# Patient Record
Sex: Male | Born: 1995 | Race: Black or African American | Hispanic: No | Marital: Single | Smoking: Never smoker
Health system: Southern US, Community
[De-identification: ages and names within clinical notes are randomized; demographics above are authoritative.]

---
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
## 2015-07-17 ENCOUNTER — Emergency Department (HOSPITAL_COMMUNITY)
Admission: EM | Admit: 2015-07-17 | Discharge: 2015-07-17 | Disposition: A | Discharge status: Home / Self Care | Payer: Self-pay | Attending: Emergency Medicine | Admitting: Emergency Medicine

## 2015-07-17 ENCOUNTER — Emergency Department (HOSPITAL_COMMUNITY): Payer: Self-pay

## 2015-07-17 ENCOUNTER — Encounter (HOSPITAL_COMMUNITY): Payer: Self-pay | Admitting: Emergency Medicine

## 2015-07-17 DIAGNOSIS — Y939 Activity, unspecified: Secondary | ICD-10-CM | POA: Insufficient documentation

## 2015-07-17 DIAGNOSIS — S71131A Puncture wound without foreign body, right thigh, initial encounter: Secondary | ICD-10-CM | POA: Insufficient documentation

## 2015-07-17 DIAGNOSIS — Y999 Unspecified external cause status: Secondary | ICD-10-CM | POA: Insufficient documentation

## 2015-07-17 DIAGNOSIS — W3400XA Accidental discharge from unspecified firearms or gun, initial encounter: Secondary | ICD-10-CM | POA: Insufficient documentation

## 2015-07-17 DIAGNOSIS — R791 Abnormal coagulation profile: Secondary | ICD-10-CM | POA: Insufficient documentation

## 2015-07-17 DIAGNOSIS — Y929 Unspecified place or not applicable: Secondary | ICD-10-CM | POA: Insufficient documentation

## 2015-07-17 DIAGNOSIS — S41031A Puncture wound without foreign body of right shoulder, initial encounter: Secondary | ICD-10-CM | POA: Insufficient documentation

## 2015-07-17 DIAGNOSIS — S71132A Puncture wound without foreign body, left thigh, initial encounter: Secondary | ICD-10-CM | POA: Insufficient documentation

## 2015-07-17 LAB — TYPE AND SCREEN
ABO/RH(D): A POS
ANTIBODY SCREEN: NEGATIVE
UNIT DIVISION: 0
Unit division: 0

## 2015-07-17 LAB — COMPREHENSIVE METABOLIC PANEL
ALBUMIN: 4.4 g/dL (ref 3.5–5.0)
ALK PHOS: 63 U/L (ref 38–126)
ALT: 15 U/L — ABNORMAL LOW (ref 17–63)
ANION GAP: 11 (ref 5–15)
AST: 27 U/L (ref 15–41)
BILIRUBIN TOTAL: 1 mg/dL (ref 0.3–1.2)
BUN: 11 mg/dL (ref 6–20)
CALCIUM: 10 mg/dL (ref 8.9–10.3)
CO2: 22 mmol/L (ref 22–32)
Chloride: 105 mmol/L (ref 101–111)
Creatinine, Ser: 1.28 mg/dL — ABNORMAL HIGH (ref 0.61–1.24)
GFR calc Af Amer: >60 mL/min (ref >60)
GFR calc non Af Amer: >60 mL/min (ref >60)
GLUCOSE: 120 mg/dL — AB (ref 65–99)
Potassium: 3.3 mmol/L — ABNORMAL LOW (ref 3.5–5.1)
SODIUM: 138 mmol/L (ref 135–145)
TOTAL PROTEIN: 7.3 g/dL (ref 6.5–8.1)

## 2015-07-17 LAB — I-STAT CHEM 8, ED
BUN: 12 mg/dL (ref 6–20)
CALCIUM ION: 1.21 mmol/L (ref 1.12–1.23)
CHLORIDE: 105 mmol/L (ref 101–111)
Creatinine, Ser: 1.1 mg/dL (ref 0.61–1.24)
Glucose, Bld: 115 mg/dL — ABNORMAL HIGH (ref 65–99)
HEMATOCRIT: 49 % (ref 39.0–52.0)
Hemoglobin: 16.7 g/dL (ref 13.0–17.0)
POTASSIUM: 3.4 mmol/L — AB (ref 3.5–5.1)
SODIUM: 142 mmol/L (ref 135–145)
TCO2: 22 mmol/L (ref 0–100)

## 2015-07-17 LAB — CBC
HEMATOCRIT: 45.9 % (ref 39.0–52.0)
HEMOGLOBIN: 15.1 g/dL (ref 13.0–17.0)
MCH: 26.7 pg (ref 26.0–34.0)
MCHC: 32.9 g/dL (ref 30.0–36.0)
MCV: 81.2 fL (ref 78.0–100.0)
Platelets: 194 10*3/uL (ref 150–400)
RBC: 5.65 MIL/uL (ref 4.22–5.81)
RDW: 13.5 % (ref 11.5–15.5)
WBC: 5.7 10*3/uL (ref 4.0–10.5)

## 2015-07-17 LAB — PREPARE FRESH FROZEN PLASMA
UNIT DIVISION: 0
Unit division: 0

## 2015-07-17 LAB — ETHANOL

## 2015-07-17 LAB — I-STAT CG4 LACTIC ACID, ED: Lactic Acid, Venous: 4.87 mmol/L (ref 0.5–2.0)

## 2015-07-17 LAB — PROTIME-INR
INR: 1.16 (ref 0.00–1.49)
Prothrombin Time: 15 seconds (ref 11.6–15.2)

## 2015-07-17 MED ORDER — BACITRACIN ZINC 500 UNIT/GM EX OINT
TOPICAL_OINTMENT | Freq: Two times a day (BID) | CUTANEOUS | Status: DC
  Administered 2015-07-17: 15:00:00 via TOPICAL
  Filled 2015-07-17: qty 0.9

## 2015-07-17 MED ORDER — ONDANSETRON HCL 4 MG/2ML IJ SOLN
4.0000 mg | Freq: Once | INTRAMUSCULAR | Status: AC
  Administered 2015-07-17: 4 mg via INTRAVENOUS
  Filled 2015-07-17: qty 2

## 2015-07-17 MED ORDER — CEFAZOLIN IN D5W 1 GM/50ML IV SOLN
1.0000 g | Freq: Once | INTRAVENOUS | Status: AC
  Administered 2015-07-17: 1 g via INTRAVENOUS
  Filled 2015-07-17: qty 50

## 2015-07-17 MED ORDER — POTASSIUM CHLORIDE CRYS ER 20 MEQ PO TBCR
40.0000 meq | EXTENDED_RELEASE_TABLET | Freq: Once | ORAL | Status: AC
  Administered 2015-07-17: 40 meq via ORAL
  Filled 2015-07-17: qty 2

## 2015-07-17 MED ORDER — SODIUM CHLORIDE 0.9 % IV BOLUS (SEPSIS)
1000.0000 mL | Freq: Once | INTRAVENOUS | Status: AC
  Administered 2015-07-17: 1000 mL via INTRAVENOUS

## 2015-07-17 MED ORDER — TETANUS-DIPHTH-ACELL PERTUSSIS 5-2.5-18.5 LF-MCG/0.5 IM SUSP
0.5000 mL | Freq: Once | INTRAMUSCULAR | Status: AC
  Administered 2015-07-17: 0.5 mL via INTRAMUSCULAR
  Filled 2015-07-17: qty 0.5

## 2015-07-17 MED ORDER — MORPHINE SULFATE (PF) 4 MG/ML IV SOLN
4.0000 mg | Freq: Once | INTRAVENOUS | Status: AC
  Administered 2015-07-17: 4 mg via INTRAVENOUS
  Filled 2015-07-17: qty 1

## 2015-07-17 MED ORDER — CEPHALEXIN 500 MG PO CAPS: 500.0000 mg | ORAL_CAPSULE | Freq: Four times a day (QID) | ORAL | Status: AC

## 2015-07-17 NOTE — ED Notes (Signed)
Pt ambulates independently and with steady gait at time of discharge. Discharge instructions and follow up information reviewed with patient. No other questions or concerns voiced at this time.  
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
## 2023-07-19 ENCOUNTER — Ambulatory Visit (HOSPITAL_COMMUNITY)
Admission: EM | Admit: 2023-07-19 | Discharge: 2023-07-19 | Disposition: A | Discharge status: Home / Self Care | Payer: Self-pay

## 2023-07-19 ENCOUNTER — Encounter (HOSPITAL_COMMUNITY): Payer: Self-pay

## 2023-07-19 DIAGNOSIS — J069 Acute upper respiratory infection, unspecified: Secondary | ICD-10-CM

## 2023-07-19 NOTE — Discharge Instructions (Signed)
 For most people and most upper respiratory infections, symptoms are self-limited. The usual course and duration of illness is up to 7-10 days. Because upper respiratory infections are usually caused by viruses, antibiotics will not help and often cause nausea and diarrhea.  Analgesics like Tylenol (acetaminophen) or Motrin  (ibuprofen ) may be used to relieve associated symptoms (eg, headache, ear pain, muscle and joint pains, and malaise). Supportive therapies are the main treatments you can perform including getting plenty of rest, drinking lots of fluids (water, juice, or broth), having warm tea or soup to help with sore throat, using cool-mist humidifier, using saline nose drops or spray to relieve stuffiness, and avoid smoking or being around smoke. You can also try over-the-counter medications including cough medicines that include an antihistamine and decongestant, cough drops, throat sprays, or inhaled/intranasal cromolyn sodium. You should return to the ED or UC if you experience any difficulty breathing, cough up blood, your symptoms do not improve/worsen, or if you are unable to keep down any food or liquids. Lastly, you should follow-up with your primary care provider in the next several days.

## 2023-07-19 NOTE — ED Provider Notes (Signed)
 [facility]    CSN: [id number] Arrival date & time: 07/19/23  9197      History   Chief Complaint Chief Complaint  Patient presents with   Sore Throat   Fever   Cough    HPI Brendan Sloan is a 28 y.o. male.   HPI Patient is a 28 year old male who presents to the urgent care today with concerns of fever, chills, body aches, scratchy throat, and cough.  He reports his symptoms began yesterday.  He took Tylenol, ibuprofen, and Alka-Seltzer medication to help with his symptoms.  He reports that the Alka-Seltzer helps with his cough.  He has not taken any medicines today.  He denies any vomiting, diarrhea, rash, chest pain, shortness of breath, or other concerns at this additionally, he reports that the scratchy throat has improved and he is no longer having throat symptoms. History reviewed. No pertinent past medical history.  There are no active problems to display for this patient.   History reviewed. No pertinent surgical history.     Home Medications    Prior to Admission medications   Medication Sig Start Date End Date Taking? Authorizing Provider  cephALEXin  (KEFLEX ) 500 MG capsule Take 1 capsule (500 mg total) by mouth 4 (four) times daily. 07/17/15   Bernard Drivers, MD    Family History History reviewed. No pertinent family history.  Social History Social History   Tobacco Use   Smoking status: Never   Smokeless tobacco: Never     Allergies   Shrimp [shellfish allergy]   Review of Systems Review of Systems See HPI for relevant ROS.  Physical Exam Triage Vital Signs ED Triage Vitals [07/19/23 0816]  Encounter Vitals Group     BP (!) 143/91     Girls Systolic BP Percentile      Girls Diastolic BP Percentile      Boys Systolic BP Percentile      Boys Diastolic BP Percentile      Pulse Rate 66     Resp 16     Temp 98.2 F (36.8 C)     Temp Source Oral     SpO2 97 %     Weight      Height      Head Circumference      Peak Flow       Pain Score      Pain Loc      Pain Education      Exclude from Growth Chart    No data found.  Updated Vital Signs BP (!) 143/91 (BP Location: Left Arm)   Pulse 66   Temp 98.2 F (36.8 C) (Oral)   Resp 16   SpO2 97%   Visual Acuity Right Eye Distance:   Left Eye Distance:   Bilateral Distance:    Right Eye Near:   Left Eye Near:    Bilateral Near:     Physical Exam General: Alert and oriented, well-developed/well-nourished, calm, cooperative, no acute distress HEENT: Normocephalic atraumatic, moist mucous membranes, no scleral icterus, trachea midline Lungs: Speaking full sentences, non-labored respirations, no distress, clear to auscultation bilaterally Heart: Regular rate and rhythm Abdomen:  Soft, nondistended Musculoskeletal: Moves all extremities well Neurologic: Awake, A&O x4, gait normal Integumentary: Warm, dry, normal for ethnicity, intact, no rash Psychiatric: Appropriate mood & affect  UC Treatments / Results  Labs (all labs ordered are listed, but only abnormal results are displayed) Labs Reviewed - No data to display  EKG   Radiology  No results found.  Procedures Procedures (including critical care time)  Medications Ordered in UC Medications - No data to display  Initial Impression / Assessment and Plan / UC Course  I have reviewed the triage vital signs and the nursing notes.  Pertinent labs & imaging results that were available during my care of the patient were reviewed by me and considered in my medical decision making (see chart for details).    Presents with cough, chills, body aches, fever, scratchy throat.  Differential diagnosis includes: URI, COVID, influenza, sinusitis, pneumonia, pharyngitis, asthma, allergies, including other diagnoses.  History obtained from: Patient.  Plan: This patient presents with symptoms suspicious for likely viral upper respiratory infection. Based on history and physical doubt bacterial  sinusitis. Do not suspect underlying cardiopulmonary process. I considered, but think unlikely, dangerous causes of this patient's symptoms to include pneumonia. Patient is nontoxic appearing, stable, and in no acute distress, therefore, patient likely stable for safe discharge home. Gave patient recommendations for over-the-counter medicines and remedies for symptomatic relief. Patient should follow-up with their primary care provider in the next several days.  Strict return precautions to the urgent care or emergency department were discussed including if symptoms worsen, if they become short of breath, or if they have any other concerns.  Disposition: Stable to discharge home.   All questions answered to the best of this examiner's ability. Advised to f/u with PCP for further eval and/or reassessment. Patient agrees to plan.  An appropriate evaluation has been performed, and in my medical judgment there is currently no evidence of an immediate life-threatening or surgical condition. Discharge is therefore indicated at this time.  This document was created using the aid of voice recognition Scientist, clinical (histocompatibility and immunogenetics).   Final Clinical Impressions(s) / UC Diagnoses   Final diagnoses:  Viral upper respiratory tract infection     Discharge Instructions      For most people and most upper respiratory infections, symptoms are self-limited. The usual course and duration of illness is up to 7-10 days. Because upper respiratory infections are usually caused by viruses, antibiotics will not help and often cause nausea and diarrhea.  Analgesics like Tylenol (acetaminophen) or Motrin (ibuprofen) may be used to relieve associated symptoms (eg, headache, ear pain, muscle and joint pains, and malaise). Supportive therapies are the main treatments you can perform including getting plenty of rest, drinking lots of fluids (water, juice, or broth), having warm tea or soup to help with sore throat, using  cool-mist humidifier, using saline nose drops or spray to relieve stuffiness, and avoid smoking or being around smoke. You can also try over-the-counter medications including cough medicines that include an antihistamine and decongestant, cough drops, throat sprays, or inhaled/intranasal cromolyn sodium. You should return to the ED or UC if you experience any difficulty breathing, cough up blood, your symptoms do not improve/worsen, or if you are unable to keep down any food or liquids. Lastly, you should follow-up with your primary care provider in the next several days.     ED Prescriptions   None    PDMP not reviewed this encounter.   Melonie Locus, PA-C 07/19/23 [phone]

## 2023-07-19 NOTE — ED Triage Notes (Signed)
 Patient states he has a dry cough.  Patient states yesterday he had a fever. Patient states his throat is itchy.

## 2023-11-25 ENCOUNTER — Emergency Department (HOSPITAL_COMMUNITY)
Admission: EM | Admit: 2023-11-25 | Discharge: 2023-11-25 | Disposition: A | Discharge status: Home / Self Care | Payer: Self-pay | Attending: Emergency Medicine | Admitting: Emergency Medicine

## 2023-11-25 ENCOUNTER — Encounter (HOSPITAL_COMMUNITY): Payer: Self-pay | Admitting: Pharmacy Technician

## 2023-11-25 ENCOUNTER — Other Ambulatory Visit: Payer: Self-pay

## 2023-11-25 ENCOUNTER — Emergency Department (HOSPITAL_COMMUNITY): Payer: Self-pay

## 2023-11-25 DIAGNOSIS — S4981XA Other specified injuries of right shoulder and upper arm, initial encounter: Secondary | ICD-10-CM

## 2023-11-25 DIAGNOSIS — S40011A Contusion of right shoulder, initial encounter: Secondary | ICD-10-CM | POA: Insufficient documentation
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
## 2023-11-25 NOTE — ED Provider Triage Note (Signed)
 Emergency Medicine Provider Triage Evaluation Note  Brendan Sloan , a 28 y.o. male  was evaluated in triage.  Pt complains of right clavicle pain after breaking down a door yesterday.  Denies any other injuries or complaints  Review of Systems  Positive:  Negative:   Physical Exam  BP 128/76   Pulse 83   Temp 97.8 F (36.6 C)   Resp 16   SpO2 97%  Gen:   Awake, no distress   Resp:  Normal effort  MSK:   Moves extremities without difficulty  Other:    Medical Decision Making  Medically screening exam initiated at 3:49 PM.  Appropriate orders placed.  Brendan Sloan was informed that the remainder of the evaluation will be completed by another provider, this initial triage assessment does not replace that evaluation, and the importance of remaining in the ED until their evaluation is complete.     Brendan Sloan LABOR, PA-C 11/25/23 1550

## 2023-11-25 NOTE — ED Triage Notes (Signed)
 Pt c/o R clavicle pain that started after he broke down a door last night

## 2023-11-25 NOTE — Discharge Instructions (Signed)
 You were seen in the emergency room for right clavicle pain The x-ray did not show a broken bone This is most likely a bruised bone (contusion) Take Tylenol Motrin as directed for pain
